# Patient Record
Sex: Female | Born: 2000 | Race: Black or African American | Hispanic: No | Marital: Single | Smoking: Never smoker
Health system: Southern US, Community
[De-identification: ages and names within clinical notes are randomized; demographics above are authoritative.]

## PROBLEM LIST (undated history)

## (undated) DIAGNOSIS — K589 Irritable bowel syndrome without diarrhea: Secondary | ICD-10-CM

## (undated) DIAGNOSIS — F419 Anxiety disorder, unspecified: Secondary | ICD-10-CM

## (undated) HISTORY — PX: HERNIA REPAIR: SHX51

## (undated) HISTORY — PX: INNER EAR SURGERY: SHX679

## (undated) HISTORY — PX: TONSILLECTOMY: SUR1361

---
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
## 2019-06-12 ENCOUNTER — Ambulatory Visit
Admission: EM | Admit: 2019-06-12 | Discharge: 2019-06-12 | Disposition: A | Discharge status: Home / Self Care | Payer: BC Managed Care – PPO | Attending: Family Medicine | Admitting: Family Medicine

## 2019-06-12 ENCOUNTER — Other Ambulatory Visit: Payer: Self-pay

## 2019-06-12 DIAGNOSIS — K0889 Other specified disorders of teeth and supporting structures: Secondary | ICD-10-CM | POA: Diagnosis not present

## 2019-06-12 HISTORY — DX: Irritable bowel syndrome, unspecified: K58.9

## 2019-06-12 HISTORY — DX: Anxiety disorder, unspecified: F41.9

## 2019-06-12 MED ORDER — AMOXICILLIN-POT CLAVULANATE 875-125 MG PO TABS: 1.0000 | ORAL_TABLET | Freq: Two times a day (BID) | ORAL | 0 refills | Status: DC

## 2019-06-12 MED ORDER — KETOROLAC TROMETHAMINE 10 MG PO TABS: 10.0000 mg | ORAL_TABLET | Freq: Four times a day (QID) | ORAL | 0 refills | Status: DC | PRN

## 2019-06-12 NOTE — Discharge Instructions (Signed)
Medication as prescribed.  Take care  Dr. Joleena Weisenburger  

## 2019-06-12 NOTE — ED Provider Notes (Signed)
[facility]    CSN: [id number] Arrival date & time: 06/12/19  1557      History   Chief Complaint Chief Complaint  Patient presents with  . Dental Pain   HPI  19 year old female presents with dental pain.  4-day history of left lower gum pain. Pain originates from the last molar on the left lower side. Pain severe, 9/10 in severity. She has called the dentist but has not been able to secure a sooner appointment. She states that she has had 1 day where her temperature was elevated at 100. No current fever. No relieving factors. Difficulty eating secondary to pain. No other associated symptoms. No other complaints.  Past Medical History:  Diagnosis Date  . Anxiety   . IBS (irritable bowel syndrome)    Past Surgical History:  Procedure Laterality Date  . HERNIA REPAIR    . INNER EAR SURGERY    . TONSILLECTOMY      OB History   No obstetric history on file.    Home Medications    Prior to Admission medications   Medication Sig Start Date End Date Taking? Authorizing Provider  traZODone (DESYREL) 50 MG tablet 2 tablets qHS. 05/30/19  Yes [provider]  amoxicillin-clavulanate (AUGMENTIN) 875-125 MG tablet Take 1 tablet by mouth every 12 (twelve) hours. 06/12/19   Coral Spikes, DO  escitalopram (LEXAPRO) 10 MG tablet Take 10 mg by mouth daily. 05/20/19   [provider]  ketorolac (TORADOL) 10 MG tablet Take 1 tablet (10 mg total) by mouth every 6 (six) hours as needed for moderate pain or severe pain. 06/12/19   Coral Spikes, DO   Social History Social History   Tobacco Use  . Smoking status: Never Smoker  . Smokeless tobacco: Never Used  . Tobacco comment: Hooka  Substance Use Topics  . Alcohol use: Never  . Drug use: Never     Allergies   Justicia adhatoda (malabar nut tree)  [justicia adhatoda]   Review of Systems Review of Systems  Constitutional: Negative.   HENT: Positive for dental problem.    Physical Exam Triage  Vital Signs ED Triage Vitals  Enc Vitals Group     BP 06/12/19 1617 120/82     Pulse Rate 06/12/19 1617 77     Resp 06/12/19 1617 16     Temp 06/12/19 1617 98.2 F (36.8 C)     Temp Source 06/12/19 1617 Oral     SpO2 06/12/19 1617 100 %     Weight 06/12/19 1617 175 lb (79.4 kg)     Height 06/12/19 1617 5\' 5"  (1.651 m)     Head Circumference --      Peak Flow --      Pain Score 06/12/19 1614 9     Pain Loc --      Pain Edu? --      Excl. in [place]? --    Updated Vital Signs BP 120/82 (BP Location: Right Arm)   Pulse 77   Temp 98.2 F (36.8 C) (Oral)   Resp 16   Ht 5\' 5"  (1.651 m)   Wt 79.4 kg   LMP 05/31/2019   SpO2 100%   BMI 29.12 kg/m   Visual Acuity Right Eye Distance:   Left Eye Distance:   Bilateral Distance:    Right Eye Near:   Left Eye Near:    Bilateral Near:     Physical Exam Vitals and nursing note reviewed.  Constitutional:  General: She is not in acute distress.    Appearance: Normal appearance. She is not ill-appearing.  HENT:     Mouth/Throat:      Comments: Labeled tooth is exquisitely tender to palpation. Mild erythema of the posterior gum. Eyes:     General:        Right eye: No discharge.        Left eye: No discharge.     Conjunctiva/sclera: Conjunctivae normal.  Cardiovascular:     Rate and Rhythm: Normal rate and regular rhythm.  Pulmonary:     Effort: Pulmonary effort is normal. No respiratory distress.     Breath sounds: Normal breath sounds.  Neurological:     Mental Status: She is alert.  Psychiatric:        Mood and Affect: Mood normal.        Behavior: Behavior normal.    UC Treatments / Results  Labs (all labs ordered are listed, but only abnormal results are displayed) Labs Reviewed - No data to display  EKG   Radiology No results found.  Procedures Procedures (including critical care time)  Medications Ordered in UC Medications - No data to display  Initial Impression / Assessment and Plan / UC Course    I have reviewed the triage vital signs and the nursing notes.  Pertinent labs & imaging results that were available during my care of the patient were reviewed by me and considered in my medical decision making (see chart for details).    19 year old female presents with dental pain.  No discrete abscess.  Patient does have an area of erythema of the gum.  Tooth is exquisitely tender to palpation.  Placing empirically on Augmentin to cover for infection.  Toradol as needed for pain.  Information given regarding local dentist.  Final Clinical Impressions(s) / UC Diagnoses   Final diagnoses:  Pain, dental     Discharge Instructions     Medication as prescribed.  Take care  Dr. Adriana Simas    ED Prescriptions    Medication Sig Dispense Auth. Provider   ketorolac (TORADOL) 10 MG tablet Take 1 tablet (10 mg total) by mouth every 6 (six) hours as needed for moderate pain or severe pain. 20 tablet Dorlisa Savino G, DO   amoxicillin-clavulanate (AUGMENTIN) 875-125 MG tablet Take 1 tablet by mouth every 12 (twelve) hours. 14 tablet Tommie Sams, DO     PDMP not reviewed this encounter.   Tommie Sams, [place] 06/12/19 1741

## 2019-06-12 NOTE — ED Triage Notes (Signed)
Pt states she started out having jaw pain which has progressed into left lower dental pain. "Excrutiating" x past 4 days. Has dental appointment May 24th.

## 2020-04-02 ENCOUNTER — Encounter: Payer: Self-pay | Admitting: Emergency Medicine

## 2020-04-02 ENCOUNTER — Emergency Department: Payer: No Typology Code available for payment source

## 2020-04-02 ENCOUNTER — Observation Stay
Admission: EM | Admit: 2020-04-02 | Discharge: 2020-04-02 | Disposition: A | Discharge status: Home / Self Care | Payer: No Typology Code available for payment source | Attending: Obstetrics and Gynecology | Admitting: Obstetrics and Gynecology

## 2020-04-02 ENCOUNTER — Encounter: Payer: Self-pay | Admitting: Obstetrics and Gynecology

## 2020-04-02 ENCOUNTER — Other Ambulatory Visit: Payer: Self-pay

## 2020-04-02 ENCOUNTER — Emergency Department
Admission: EM | Admit: 2020-04-02 | Discharge: 2020-04-02 | Disposition: A | Discharge status: Home / Self Care | Payer: No Typology Code available for payment source | Source: Home / Self Care | Attending: Emergency Medicine | Admitting: Emergency Medicine

## 2020-04-02 DIAGNOSIS — O26892 Other specified pregnancy related conditions, second trimester: Secondary | ICD-10-CM | POA: Insufficient documentation

## 2020-04-02 DIAGNOSIS — R102 Pelvic and perineal pain: Secondary | ICD-10-CM

## 2020-04-02 DIAGNOSIS — O368130 Decreased fetal movements, third trimester, not applicable or unspecified: Secondary | ICD-10-CM

## 2020-04-02 DIAGNOSIS — O26893 Other specified pregnancy related conditions, third trimester: Secondary | ICD-10-CM

## 2020-04-02 DIAGNOSIS — O99892 Other specified diseases and conditions complicating childbirth: Secondary | ICD-10-CM | POA: Insufficient documentation

## 2020-04-02 DIAGNOSIS — R1030 Lower abdominal pain, unspecified: Secondary | ICD-10-CM | POA: Insufficient documentation

## 2020-04-02 DIAGNOSIS — O9A213 Injury, poisoning and certain other consequences of external causes complicating pregnancy, third trimester: Principal | ICD-10-CM | POA: Insufficient documentation

## 2020-04-02 DIAGNOSIS — Y92410 Unspecified street and highway as the place of occurrence of the external cause: Secondary | ICD-10-CM | POA: Insufficient documentation

## 2020-04-02 DIAGNOSIS — M542 Cervicalgia: Secondary | ICD-10-CM | POA: Insufficient documentation

## 2020-04-02 DIAGNOSIS — Z3A28 28 weeks gestation of pregnancy: Secondary | ICD-10-CM | POA: Insufficient documentation

## 2020-04-02 DIAGNOSIS — R55 Syncope and collapse: Secondary | ICD-10-CM | POA: Insufficient documentation

## 2020-04-02 DIAGNOSIS — Z3490 Encounter for supervision of normal pregnancy, unspecified, unspecified trimester: Secondary | ICD-10-CM

## 2020-04-02 DIAGNOSIS — O2942 Spinal and epidural anesthesia induced headache during pregnancy, second trimester: Secondary | ICD-10-CM | POA: Insufficient documentation

## 2020-04-02 DIAGNOSIS — O26899 Other specified pregnancy related conditions, unspecified trimester: Secondary | ICD-10-CM

## 2020-04-02 LAB — RUPTURE OF MEMBRANE (ROM)PLUS: Rom Plus: NEGATIVE

## 2020-04-02 MED ORDER — ACETAMINOPHEN 500 MG PO TABS
1000.0000 mg | ORAL_TABLET | Freq: Once | ORAL | Status: AC
  Administered 2020-04-02: 1000 mg via ORAL
  Filled 2020-04-02: qty 2
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
## 2020-04-02 NOTE — ED Notes (Addendum)
Dr. Larinda Buttery states to call CT to make patient next, do not need to do continuous fetal monitoring at this time if going to CT.  CT called to get patient next.  CT states on the way.
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
## 2020-04-02 NOTE — OB Triage Note (Signed)
Pt discharged home in stable condition. RN provided pt with discharge instructions, including information on when to come back. Pt verbalized understanding and all questions answered at this time.
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
## 2020-12-30 HISTORY — PX: APPENDECTOMY: SHX54

## 2021-11-05 ENCOUNTER — Ambulatory Visit
Admission: EM | Admit: 2021-11-05 | Discharge: 2021-11-05 | Disposition: A | Discharge status: Home / Self Care | Payer: Self-pay | Attending: Family Medicine | Admitting: Family Medicine

## 2021-11-05 DIAGNOSIS — B349 Viral infection, unspecified: Secondary | ICD-10-CM | POA: Insufficient documentation

## 2021-11-05 DIAGNOSIS — Z1152 Encounter for screening for COVID-19: Secondary | ICD-10-CM | POA: Insufficient documentation

## 2021-11-05 DIAGNOSIS — Z79899 Other long term (current) drug therapy: Secondary | ICD-10-CM | POA: Insufficient documentation

## 2021-11-05 LAB — RESP PANEL BY RT-PCR (FLU A&B, COVID) ARPGX2
Influenza A by PCR: NEGATIVE
Influenza B by PCR: NEGATIVE
SARS Coronavirus 2 by RT PCR: NEGATIVE

## 2021-11-05 MED ORDER — ONDANSETRON 8 MG PO TBDP: 8.0000 mg | ORAL_TABLET | Freq: Three times a day (TID) | ORAL | 0 refills | Status: AC | PRN

## 2021-11-05 MED ORDER — BENZONATATE 100 MG PO CAPS: 200.0000 mg | ORAL_CAPSULE | Freq: Three times a day (TID) | ORAL | 0 refills | Status: AC

## 2021-11-05 MED ORDER — PROMETHAZINE-DM 6.25-15 MG/5ML PO SYRP: 5.0000 mL | ORAL_SOLUTION | Freq: Four times a day (QID) | ORAL | 0 refills | Status: AC | PRN

## 2021-11-05 MED ORDER — IPRATROPIUM BROMIDE 0.06 % NA SOLN: 2.0000 | Freq: Four times a day (QID) | NASAL | 12 refills | Status: AC

## 2021-11-05 NOTE — Discharge Instructions (Signed)
Your test today was negative for both COVID and influenza.  I do believe you have a viral illness.  Use the Atrovent nasal spray, 2 squirts in each nostril every 6 hours, as needed for runny nose and postnasal drip.  Use the Tessalon Perles every 8 hours during the day.  Take them with a small sip of water.  They may give you some numbness to the base of your tongue or a metallic taste in your mouth, this is normal.  Use the Promethazine DM cough syrup at bedtime for cough and congestion.  It will make you drowsy so do not take it during the day.  Use over-the-counter Tylenol and ibuprofen according to package instructions as needed for body aches and headache.  Take the Zofran every 8 hours as needed for nausea and vomiting.  I recommend that you orally rehydrate with clear liquids to place what you are losing in the way of diarrhea.  Do not take Imodium so as not to trap infection in your bowels and cause worsening symptoms.  Return for reevaluation or see your primary care provider for any new or worsening symptoms.

## 2021-11-05 NOTE — ED Provider Notes (Signed)
[facility]    CSN: [id number] Arrival date & time: 11/05/21  1257      History   Chief Complaint Chief Complaint  Patient presents with   Headache   Chills   Diarrhea    HPI Victoria Spears is a 21 y.o. female.   HPI  21 year old female here for evaluation of viral complaints.  Patient reports that she developed a runny nose 3 to 4 days ago which she attributed to allergies that she has all year-round.  This morning at around 1 AM she developed some diarrhea and as the day progressed her symptoms became worse to include headache, body aches, chills, sweats, and a nonproductive cough.  She is also had some nausea and dry heaving.  She denies wheezing.  Patient also states that she felt funny in her chest when she was walking from the main hospital at [facility] through the tunnel to her car.  She is able to speak in full sentences without any dyspnea or tachypnea.  Past Medical History:  Diagnosis Date   Anxiety    IBS (irritable bowel syndrome)     Patient Active Problem List   Diagnosis Date Noted   Pregnancy 04/02/2020    Past Surgical History:  Procedure Laterality Date   APPENDECTOMY  12/2020   HERNIA REPAIR     INNER EAR SURGERY     TONSILLECTOMY      OB History     Gravida  1   Para      Term      Preterm      AB      Living         SAB      IAB      Ectopic      Multiple      Live Births               Home Medications    Prior to Admission medications   Medication Sig Start Date End Date Taking? Authorizing Provider  benzonatate (TESSALON) 100 MG capsule Take 2 capsules (200 mg total) by mouth every 8 (eight) hours. 11/05/21  Yes Becky Augusta, NP  ipratropium (ATROVENT) 0.06 % nasal spray Place 2 sprays into both nostrils 4 (four) times daily. 11/05/21  Yes Becky Augusta, NP  ondansetron (ZOFRAN-ODT) 8 MG disintegrating tablet Take 1 tablet (8 mg total) by mouth every 8 (eight) hours as needed for nausea or  vomiting. 11/05/21  Yes Becky Augusta, NP  promethazine-dextromethorphan (PROMETHAZINE-DM) 6.25-15 MG/5ML syrup Take 5 mLs by mouth 4 (four) times daily as needed. 11/05/21  Yes Becky Augusta, NP  escitalopram (LEXAPRO) 10 MG tablet Take 10 mg by mouth daily. 05/20/19   [provider]  traZODone (DESYREL) 50 MG tablet 2 tablets qHS. 05/30/19   [provider]    Family History History reviewed. No pertinent family history.  Social History Social History   Tobacco Use   Smoking status: Never   Smokeless tobacco: Never   Tobacco comments:    Hooka  Vaping Use   Vaping Use: Never used  Substance Use Topics   Alcohol use: Never   Drug use: Never     Allergies   Justicia adhatoda (malabar nut tree)  [justicia adhatoda]   Review of Systems Review of Systems  Constitutional:  Positive for chills and diaphoresis. Negative for fever.  HENT:  Positive for congestion and rhinorrhea.   Respiratory:  Positive for cough. Negative for shortness of breath and  wheezing.   Gastrointestinal:  Positive for diarrhea and nausea. Negative for vomiting.  Musculoskeletal:  Positive for arthralgias and myalgias.  Skin:  Negative for rash.  Neurological:  Positive for headaches.  Hematological: Negative.   Psychiatric/Behavioral: Negative.       Physical Exam Triage Vital Signs ED Triage Vitals  Enc Vitals Group     BP 11/05/21 1308 114/77     Pulse Rate 11/05/21 1308 64     Resp --      Temp 11/05/21 1308 98.1 F (36.7 C)     Temp Source 11/05/21 1308 Oral     SpO2 11/05/21 1308 100 %     Weight 11/05/21 1305 180 lb (81.6 kg)     Height 11/05/21 1305 5\' 5"  (1.651 m)     Head Circumference --      Peak Flow --      Pain Score 11/05/21 1305 7     Pain Loc --      Pain Edu? --      Excl. in [place]? --    No data found.  Updated Vital Signs BP 114/77 (BP Location: Left Arm)   Pulse 64   Temp 98.1 F (36.7 C) (Oral)   Ht 5\' 5"  (1.651 m)   Wt 180 lb (81.6 kg)   SpO2  100%   Breastfeeding No   BMI 29.95 kg/m   Visual Acuity Right Eye Distance:   Left Eye Distance:   Bilateral Distance:    Right Eye Near:   Left Eye Near:    Bilateral Near:     Physical Exam Vitals and nursing note reviewed.  Constitutional:      Appearance: Normal appearance. She is not ill-appearing.  HENT:     Head: Normocephalic and atraumatic.     Right Ear: Tympanic membrane, ear canal and external ear normal. There is no impacted cerumen.     Left Ear: Tympanic membrane, ear canal and external ear normal. There is no impacted cerumen.     Nose: Congestion and rhinorrhea present.     Mouth/Throat:     Mouth: Mucous membranes are moist.     Pharynx: Oropharynx is clear. No oropharyngeal exudate or posterior oropharyngeal erythema.  Cardiovascular:     Rate and Rhythm: Normal rate and regular rhythm.     Pulses: Normal pulses.     Heart sounds: Normal heart sounds. No murmur heard.    No friction rub. No gallop.  Pulmonary:     Effort: Pulmonary effort is normal.     Breath sounds: Normal breath sounds. No wheezing, rhonchi or rales.  Musculoskeletal:     Cervical back: Normal range of motion and neck supple.  Lymphadenopathy:     Cervical: No cervical adenopathy.  Skin:    General: Skin is warm and dry.     Capillary Refill: Capillary refill takes less than 2 seconds.     Findings: No erythema or rash.  Neurological:     General: No focal deficit present.     Mental Status: She is alert and oriented to person, place, and time.  Psychiatric:        Mood and Affect: Mood normal.        Behavior: Behavior normal.        Thought Content: Thought content normal.        Judgment: Judgment normal.      UC Treatments / Results  Labs (all labs ordered are listed, but only abnormal results are displayed)  Labs Reviewed  RESP PANEL BY RT-PCR (FLU A&B, COVID) [id number]    EKG   Radiology No results found.  Procedures Procedures (including critical care  time)  Medications Ordered in UC Medications - No data to display  Initial Impression / Assessment and Plan / UC Course  I have reviewed the triage vital signs and the nursing notes.  Pertinent labs & imaging results that were available during my care of the patient were reviewed by me and considered in my medical decision making (see chart for details).   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  She states she has had a runny nose for 3 to 4 days and attributes this to her year-round allergies.  The remainder the symptoms consist of headache, body aches, nonproductive cough, chills, sweating, nausea, dry heaving, and diarrhea all started today.  Patient is in no acute distress and she is able to speak in full sentences dyspnea or tachypnea.  She does have erythema and edema of her nasal mucosa with clear rhinorrhea.  Oropharyngeal exam is benign.  Cardiopulmonary exam physical lung sounds in all fields.  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  COVID and flu PCR are negative.  I will discharge patient with a diagnosis of viral illness.  I will prescribe Atrovent nasal spray that she can use for nasal congestion, Tessalon Perles and Promethazine DM cough syrup as she can needed for cough, and Zofran as needed for nausea.   Final Clinical Impressions(s) / UC Diagnoses   Final diagnoses:  Viral illness     Discharge Instructions      Your test today was negative for both COVID and influenza.  I do believe you have a viral illness.  Use the Atrovent nasal spray, 2 squirts in each nostril every 6 hours, as needed for runny nose and postnasal drip.  Use the Tessalon Perles every 8 hours during the day.  Take them  with a small sip of water.  They may give you some numbness to the base of your tongue or a metallic taste in your mouth, this is normal.  Use the Promethazine DM cough syrup at bedtime for cough and congestion.  It will make you drowsy so do not take it during the day.  Use over-the-counter Tylenol and ibuprofen according to package instructions as needed for body aches and headache.  Take the Zofran every 8 hours as needed for nausea and vomiting.  I recommend that you orally rehydrate with clear liquids to place what you are losing in the way of diarrhea.  Do not take Imodium so as not to trap infection in your bowels and cause worsening symptoms.  Return for reevaluation or see your primary care provider for any new or worsening symptoms.      ED Prescriptions     Medication Sig Dispense Auth. Provider   benzonatate (TESSALON) 100 MG capsule Take 2 capsules (200 mg total) by mouth every 8 (eight) hours. 21 capsule Becky Augusta, NP   ipratropium (ATROVENT) 0.06 % nasal spray Place 2 sprays into both nostrils 4 (four) times daily. 15 mL Becky Augusta, NP   promethazine-dextromethorphan (PROMETHAZINE-DM) 6.25-15 MG/5ML syrup Take 5 mLs by mouth 4 (four) times daily as needed.  118 mL Becky Augusta, NP   ondansetron (ZOFRAN-ODT) 8 MG disintegrating tablet Take 1 tablet (8 mg total) by mouth every 8 (eight) hours as needed for nausea or vomiting. 20 tablet Becky Augusta, NP      PDMP not reviewed this encounter.   Becky Augusta, NP 11/05/21 1357

## 2021-11-05 NOTE — ED Triage Notes (Signed)
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

## 2022-03-21 ENCOUNTER — Other Ambulatory Visit: Payer: Self-pay

## 2022-03-21 ENCOUNTER — Ambulatory Visit (INDEPENDENT_AMBULATORY_CARE_PROVIDER_SITE_OTHER): Payer: Self-pay

## 2022-03-21 ENCOUNTER — Ambulatory Visit
Admission: EM | Admit: 2022-03-21 | Discharge: 2022-03-21 | Disposition: A | Discharge status: Home / Self Care | Payer: Self-pay | Attending: Family Medicine | Admitting: Family Medicine

## 2022-03-21 DIAGNOSIS — W19XXXA Unspecified fall, initial encounter: Secondary | ICD-10-CM

## 2022-03-21 DIAGNOSIS — M545 Low back pain, unspecified: Secondary | ICD-10-CM

## 2022-03-21 DIAGNOSIS — R1030 Lower abdominal pain, unspecified: Secondary | ICD-10-CM | POA: Insufficient documentation

## 2022-03-21 LAB — URINALYSIS, W/ REFLEX TO CULTURE (INFECTION SUSPECTED)
Glucose, UA: NEGATIVE mg/dL
Ketones, ur: NEGATIVE mg/dL
Nitrite: NEGATIVE
Protein, ur: 100 mg/dL — AB
RBC / HPF: >50 RBC/hpf (ref 0–5)
Specific Gravity, Urine: >1.03 — ABNORMAL HIGH (ref 1.005–1.030)
pH: 6 (ref 5.0–8.0)

## 2022-03-21 LAB — PREGNANCY, URINE: Preg Test, Ur: NEGATIVE

## 2022-03-21 MED ORDER — FLUCONAZOLE 150 MG PO TABS: 150.0000 mg | ORAL_TABLET | ORAL | 0 refills | Status: AC

## 2022-03-21 MED ORDER — CYCLOBENZAPRINE HCL 10 MG PO TABS: 10.0000 mg | ORAL_TABLET | Freq: Three times a day (TID) | ORAL | 0 refills | Status: AC | PRN

## 2022-03-21 MED ORDER — IBUPROFEN 600 MG PO TABS: 600.0000 mg | ORAL_TABLET | Freq: Four times a day (QID) | ORAL | 0 refills | Status: AC | PRN

## 2022-03-21 MED ORDER — CEPHALEXIN 500 MG PO CAPS: 500.0000 mg | ORAL_CAPSULE | Freq: Four times a day (QID) | ORAL | 0 refills | Status: AC

## 2022-03-21 NOTE — Discharge Instructions (Addendum)
Stop by the pharmacy to pick up your prescriptions.  Follow up with your primary care provider as needed.  We will treat you for a urinary tract infection. We sent your urine for culture. We will contact you if your gonorrhea and chlamydia tests are positive. Someone may contact your to change your antibiotics.

## 2022-03-21 NOTE — ED Provider Notes (Signed)
[facility]    CSN: [id number] Arrival date & time: 03/21/22  1654      History   Chief Complaint Chief Complaint  Patient presents with   Back Pain   Abdominal Pain    HPI Victoria Spears is a 22 y.o. female.   HPI  Victoria Spears presents for lower back pain for the past 2 weeks. She slipped at home on the hard wood floors while "run walking."  A few days later her daughter walked over her lower abdomen. Afterward she was okay but then she noticed a "growing" mass on both sides of her lower abdomen.  When she walks it hurts.  She has a vaginal discharge and dysuria.   Today, she tried to poop and a small amount of stool and blood was in the toilet bowl.  She had a clot. Patient's last menstrual period was 03/10/2022 (approximate).  Had a fever over the weekend. Took Tylenol and Motrin for pain. Last took Motrin around 12 PM.   About a month ago she felt some urinary irritation but it went away with cranberry pills. She is sexually active and does not regularly use condoms.  abdominal pain. Has history of kidney stones when she was in high school. Has IBS.    Past Medical History:  Diagnosis Date   Anxiety    IBS (irritable bowel syndrome)     Patient Active Problem List   Diagnosis Date Noted   Pregnancy 04/02/2020    Past Surgical History:  Procedure Laterality Date   APPENDECTOMY  12/2020   HERNIA REPAIR     INNER EAR SURGERY     TONSILLECTOMY      OB History     Gravida  1   Para      Term      Preterm      AB      Living         SAB      IAB      Ectopic      Multiple      Live Births               Home Medications    Prior to Admission medications   Medication Sig Start Date End Date Taking? Authorizing Provider  cephALEXin (KEFLEX) 500 MG capsule Take 1 capsule (500 mg total) by mouth 4 (four) times daily. 03/21/22  Yes Markisha Meding, DO  cyclobenzaprine (FLEXERIL) 10 MG tablet Take 1 tablet (10 mg total) by  mouth 3 (three) times daily as needed for muscle spasms. 03/21/22  Yes Rawlins Stuard, DO  fluconazole (DIFLUCAN) 150 MG tablet Take 1 tablet (150 mg total) by mouth every 3 (three) days for 2 doses. 03/21/22 03/25/22 Yes Roque Schill, DO  ibuprofen (ADVIL) 600 MG tablet Take 1 tablet (600 mg total) by mouth every 6 (six) hours as needed. 03/21/22  Yes Varun Jourdan, DO  benzonatate (TESSALON) 100 MG capsule Take 2 capsules (200 mg total) by mouth every 8 (eight) hours. 11/05/21   Margarette Canada, NP  escitalopram (LEXAPRO) 10 MG tablet Take 10 mg by mouth daily. 05/20/19   [provider]  ipratropium (ATROVENT) 0.06 % nasal spray Place 2 sprays into both nostrils 4 (four) times daily. 11/05/21   Margarette Canada, NP  ondansetron (ZOFRAN-ODT) 8 MG disintegrating tablet Take 1 tablet (8 mg total) by mouth every 8 (eight) hours as needed for nausea or vomiting. 11/05/21   Margarette Canada, NP  promethazine-dextromethorphan (PROMETHAZINE-DM)  6.25-15 MG/5ML syrup Take 5 mLs by mouth 4 (four) times daily as needed. 11/05/21   Margarette Canada, NP  traZODone (DESYREL) 50 MG tablet 2 tablets qHS. 05/30/19   [provider]    Family History History reviewed. No pertinent family history.  Social History Social History   Tobacco Use   Smoking status: Never   Smokeless tobacco: Never   Tobacco comments:    Hooka  Vaping Use   Vaping Use: Never used  Substance Use Topics   Alcohol use: Yes    Comment: Occasionally   Drug use: Never     Allergies   Justicia adhatoda (malabar nut tree)  [justicia adhatoda]   Review of Systems Review of Systems :negative unless otherwise stated in HPI.      Physical Exam Triage Vital Signs ED Triage Vitals  Enc Vitals Group     BP 03/21/22 1737 (!) 145/93     Pulse Rate 03/21/22 1737 78     Resp 03/21/22 1737 20     Temp 03/21/22 1737 98.5 F (36.9 C)     Temp src --      SpO2 03/21/22 1737 100 %     Weight --      Height --      Head  Circumference --      Peak Flow --      Pain Score 03/21/22 1733 9     Pain Loc --      Pain Edu? --      Excl. in [place]? --    No data found.  Updated Vital Signs BP (!) 145/93   Pulse 78   Temp 98.5 F (36.9 C)   Resp 20   LMP 03/10/2022 (Approximate) Comment: neg preg test  SpO2 100%   Visual Acuity Right Eye Distance:   Left Eye Distance:   Bilateral Distance:    Right Eye Near:   Left Eye Near:    Bilateral Near:     Physical Exam  GEN: non-toxic appearing female, in no acute distress  CV: regular rate and rhythm, no murmurs appreciated  RESP: no increased work of breathing, clear to ascultation bilaterally ABD: Bowel sounds present. Soft, suprapubic tenderness, non-distended. No guarding, no rebound, no appreciable hepatosplenomegaly, no CVA tenderness, negative McBurney's, negative Murphy LYMPH: bilateral enlarged inguinal lymph nodes  MSK: no extremity edema SKIN: warm, dry, no rash on visible skin NEURO: alert, moves all extremities appropriately PSYCH: Normal affect, appropriate speech and behavior   UC Treatments / Results  Labs (all labs ordered are listed, but only abnormal results are displayed) Labs Reviewed  URINE CULTURE - Abnormal; Notable for the following components:      Result Value   Culture   (*)    Value: 50,000 COLONIES/mL GROUP B STREP(S.AGALACTIAE)ISOLATED TESTING AGAINST S. AGALACTIAE NOT ROUTINELY PERFORMED DUE TO PREDICTABILITY OF AMP/PEN/VAN SUSCEPTIBILITY. Performed at [facility], [place] [address] [place],  [address]    All other components within normal limits  URINALYSIS, W/ REFLEX TO CULTURE (INFECTION SUSPECTED) - Abnormal; Notable for the following components:   APPearance HAZY (*)    Specific Gravity, Urine >1.030 (*)    Hgb urine dipstick LARGE (*)    Bilirubin Urine SMALL (*)    Protein, ur 100 (*)    Leukocytes,Ua TRACE (*)    Bacteria, UA FEW (*)    All other components within normal limits  PREGNANCY,  URINE  CERVICOVAGINAL ANCILLARY ONLY    EKG  Radiology No results found.  Procedures Procedures (including critical care time)  Medications Ordered in UC Medications - No data to display  Initial Impression / Assessment and Plan / UC Course  I have reviewed the triage vital signs and the nursing notes.  Pertinent labs & imaging results that were available during my care of the patient were reviewed by me and considered in my medical decision making (see chart for details).        Patient is a  22 y.o. femalewith history IBS who presents after having insidious abdominal pain after her daughter stepped on her a couple nights ago and lumbar pain after a fall.  Overall, patient is well-appearing, well-hydrated, and in no acute distress.  Vital signs stable.  Oliviais afebrile.  Exam is concerning for acute cystitis vs STI.  Urine pregnancy is negative.  She has evidence of possible acute cystitis on urinalysis.  Will treat with Keflex and given Diflucan as she has antibiotic associated yeast infection.  Urine culture obtained.  Stop or change antibiotics if needed.  She is to follow-up with her primary care provider or her gynecologist for her lymphadenopathy and if her symptoms do not improve.  Patient also presented for a fall.  She has low back pain.  She does have a large amount of hematuria.  However on her vaginal swab there was blood.  I suspect she may be having an early cycle.  Testing lumbar x-ray was grossly unremarkable for fracture or dislocation.  There is no significant malalignment.  Radiologist notes no vertebral body height loss.  Discussed with patient gradually returning to normal activities, as tolerated. Pt to continue ordinary activities within the limits permitted by pain. Will prescribe Ibuprofen and muscle relaxer for pain relief. Counseled patient on red flag symptoms and when to seek immediate care. No red flags suggesting cauda equina syndrome or progressive  major motor weakness. Patient to follow up with orthopedic provider, if symptoms do not improve with conservative treatment.    Follow-up, return and ED precautions given.  Discussed MDM, treatment plan and plan for follow-up with patient who agrees with plan.      Final Clinical Impressions(s) / UC Diagnoses   Final diagnoses:  Lower abdominal pain     Discharge Instructions      Stop by the pharmacy to pick up your prescriptions.  Follow up with your primary care provider as needed.  We will treat you for a urinary tract infection. We sent your urine for culture. We will contact you if your gonorrhea and chlamydia tests are positive. Someone may contact your to change your antibiotics.      ED Prescriptions     Medication Sig Dispense Auth. Provider   cephALEXin (KEFLEX) 500 MG capsule Take 1 capsule (500 mg total) by mouth 4 (four) times daily. 20 capsule Marina Boerner, DO   fluconazole (DIFLUCAN) 150 MG tablet Take 1 tablet (150 mg total) by mouth every 3 (three) days for 2 doses. 2 tablet Eurydice Calixto, DO   ibuprofen (ADVIL) 600 MG tablet Take 1 tablet (600 mg total) by mouth every 6 (six) hours as needed. 30 tablet Lynkin Saini, DO   cyclobenzaprine (FLEXERIL) 10 MG tablet Take 1 tablet (10 mg total) by mouth 3 (three) times daily as needed for muscle spasms. 30 tablet Lyndee Hensen, DO      PDMP not reviewed this encounter.   Lyndee Hensen, DO 03/24/22 Vernelle Emerald

## 2022-03-21 NOTE — ED Triage Notes (Signed)
Pt states she slipped and fell last Tuesday and then Thursday daughter stepped on abd. And now c/o lower abd and lower back pain also has a small mass in right groin area. Pt states she is also having some unexpected vaginal bleeding.

## 2022-03-22 LAB — CERVICOVAGINAL ANCILLARY ONLY
Chlamydia: NEGATIVE
Comment: NEGATIVE
Comment: NORMAL
Neisseria Gonorrhea: NEGATIVE

## 2022-03-23 LAB — URINE CULTURE: Culture: 50000 — AB
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
